# Patient Record
Sex: Male | Born: 1950 | Race: Asian | Hispanic: No | Marital: Single | Smoking: Never smoker
Health system: Southern US, Community
[De-identification: ages and names within clinical notes are randomized; demographics above are authoritative.]

## PROBLEM LIST (undated history)

## (undated) DIAGNOSIS — K219 Gastro-esophageal reflux disease without esophagitis: Secondary | ICD-10-CM

## (undated) DIAGNOSIS — I10 Essential (primary) hypertension: Secondary | ICD-10-CM

## (undated) DIAGNOSIS — R7611 Nonspecific reaction to tuberculin skin test without active tuberculosis: Secondary | ICD-10-CM

## (undated) DIAGNOSIS — H269 Unspecified cataract: Secondary | ICD-10-CM

## (undated) HISTORY — DX: Gastro-esophageal reflux disease without esophagitis: K21.9

## (undated) HISTORY — DX: Nonspecific reaction to tuberculin skin test without active tuberculosis: R76.11

## (undated) HISTORY — DX: Essential (primary) hypertension: I10

## (undated) HISTORY — DX: Unspecified cataract: H26.9

---

## 2005-11-07 DIAGNOSIS — I10 Essential (primary) hypertension: Secondary | ICD-10-CM

## 2008-08-27 ENCOUNTER — Ambulatory Visit: Payer: Self-pay | Admitting: Nurse Practitioner

## 2008-08-27 DIAGNOSIS — H269 Unspecified cataract: Secondary | ICD-10-CM | POA: Insufficient documentation

## 2008-08-27 DIAGNOSIS — K589 Irritable bowel syndrome without diarrhea: Secondary | ICD-10-CM | POA: Insufficient documentation

## 2008-08-27 DIAGNOSIS — M25569 Pain in unspecified knee: Secondary | ICD-10-CM | POA: Insufficient documentation

## 2008-08-27 DIAGNOSIS — K5900 Constipation, unspecified: Secondary | ICD-10-CM | POA: Insufficient documentation

## 2008-08-27 DIAGNOSIS — K219 Gastro-esophageal reflux disease without esophagitis: Secondary | ICD-10-CM | POA: Insufficient documentation

## 2008-08-27 LAB — CONVERTED CEMR LAB
AST: 23 units/L (ref 0–37)
Alkaline Phosphatase: 60 units/L (ref 39–117)
BUN: 13 mg/dL (ref 6–23)
Basophils Relative: 0 % (ref 0–1)
Creatinine, Ser: 0.94 mg/dL (ref 0.40–1.50)
Eosinophils Absolute: 0.2 10*3/uL (ref 0.0–0.7)
Hemoglobin: 13.8 g/dL (ref 13.0–17.0)
MCHC: 31.8 g/dL (ref 30.0–36.0)
MCV: 90.4 fL (ref 78.0–100.0)
Microalb, Ur: 0.2 mg/dL (ref 0.00–1.89)
Monocytes Absolute: 0.6 10*3/uL (ref 0.1–1.0)
Monocytes Relative: 8 % (ref 3–12)
Neutrophils Relative %: 61 % (ref 43–77)
RBC: 4.8 M/uL (ref 4.22–5.81)
Total Bilirubin: 0.4 mg/dL (ref 0.3–1.2)

## 2008-10-14 ENCOUNTER — Ambulatory Visit: Payer: Self-pay | Admitting: Nurse Practitioner

## 2008-11-13 ENCOUNTER — Encounter (INDEPENDENT_AMBULATORY_CARE_PROVIDER_SITE_OTHER): Payer: Self-pay | Admitting: Nurse Practitioner

## 2008-11-27 ENCOUNTER — Encounter (INDEPENDENT_AMBULATORY_CARE_PROVIDER_SITE_OTHER): Payer: Self-pay | Admitting: Nurse Practitioner

## 2008-12-11 ENCOUNTER — Encounter (INDEPENDENT_AMBULATORY_CARE_PROVIDER_SITE_OTHER): Payer: Self-pay | Admitting: Nurse Practitioner

## 2008-12-15 ENCOUNTER — Emergency Department (HOSPITAL_COMMUNITY): Admission: EM | Admit: 2008-12-15 | Discharge: 2008-12-15 | Payer: Self-pay | Admitting: Family Medicine

## 2009-01-12 ENCOUNTER — Ambulatory Visit: Payer: Self-pay | Admitting: Nurse Practitioner

## 2009-01-12 DIAGNOSIS — R059 Cough, unspecified: Secondary | ICD-10-CM | POA: Insufficient documentation

## 2009-01-12 DIAGNOSIS — R05 Cough: Secondary | ICD-10-CM

## 2009-02-11 ENCOUNTER — Ambulatory Visit: Payer: Self-pay | Admitting: Nurse Practitioner

## 2010-01-17 IMAGING — CR DG CHEST 2V
2 series · 2 of 2 positions shown · non-contrast
Comparison: No priors

CLINICAL DATA: Cough/chest pain

CHEST - 2 VIEW

[view not recorded (1 of 2)]
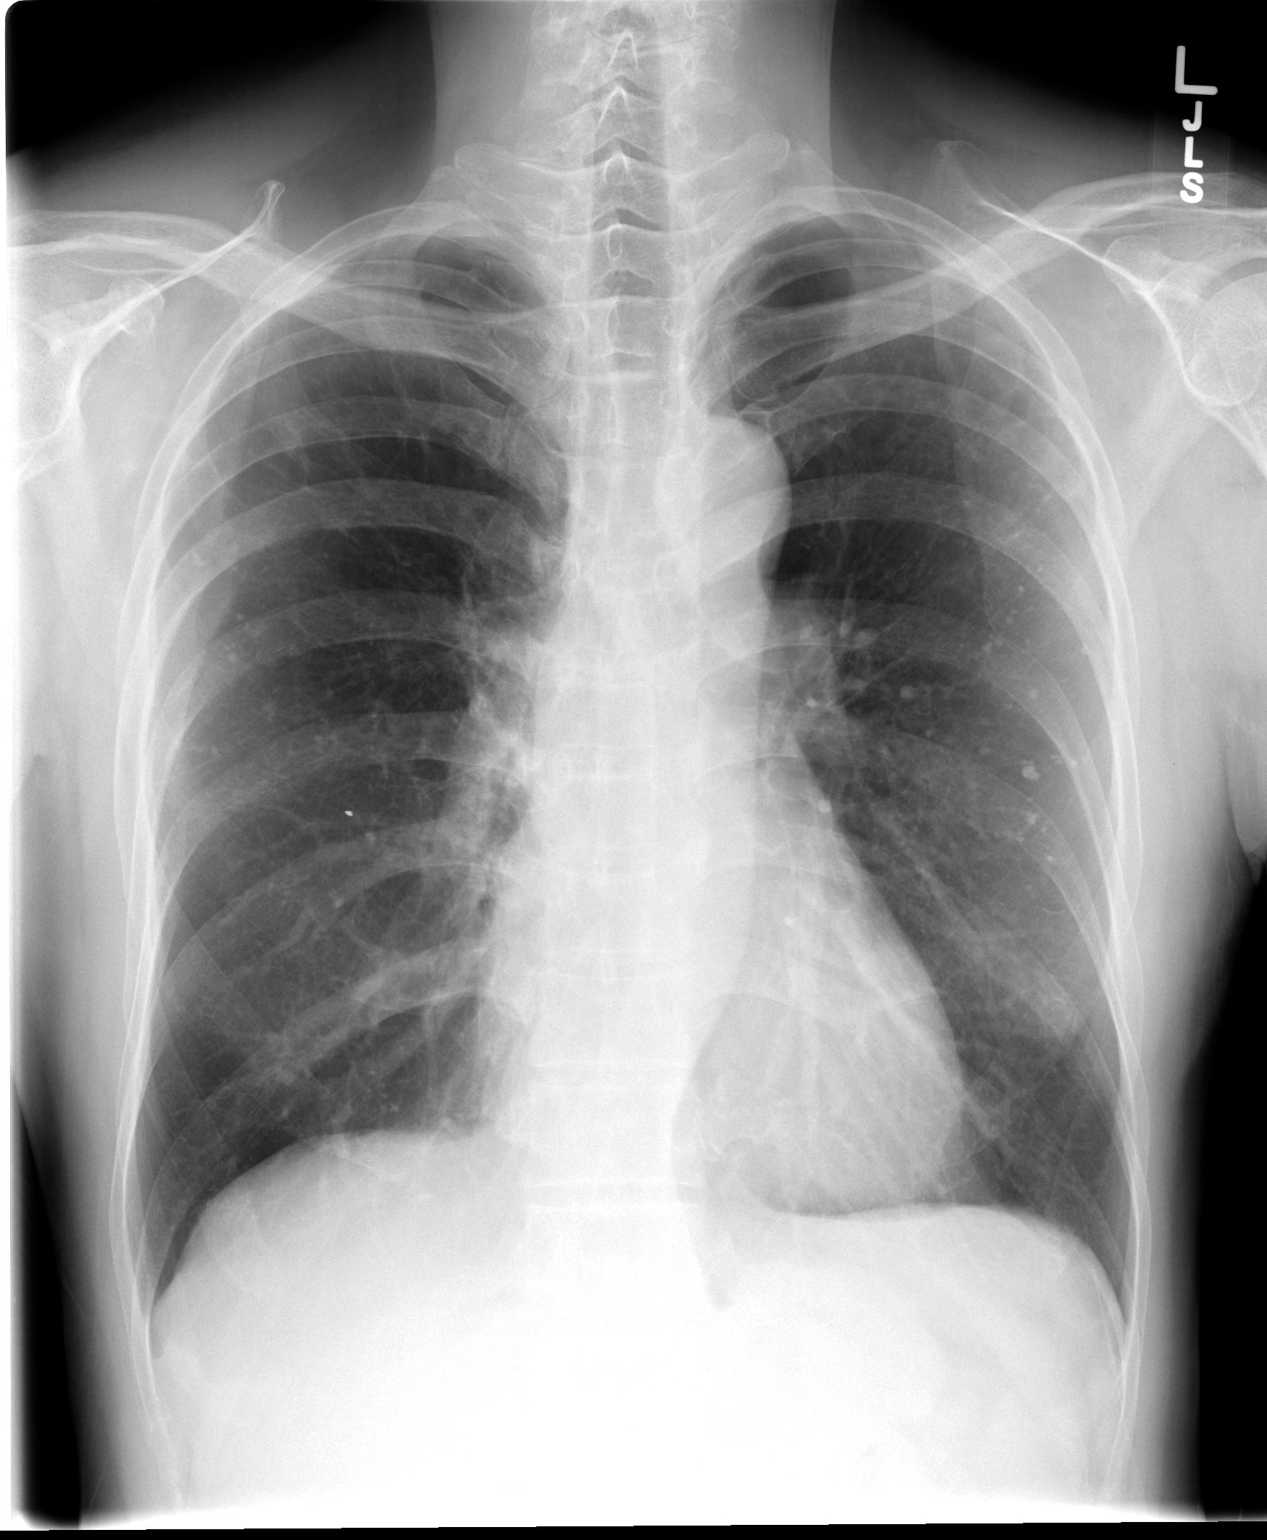

[view not recorded (2 of 2)]
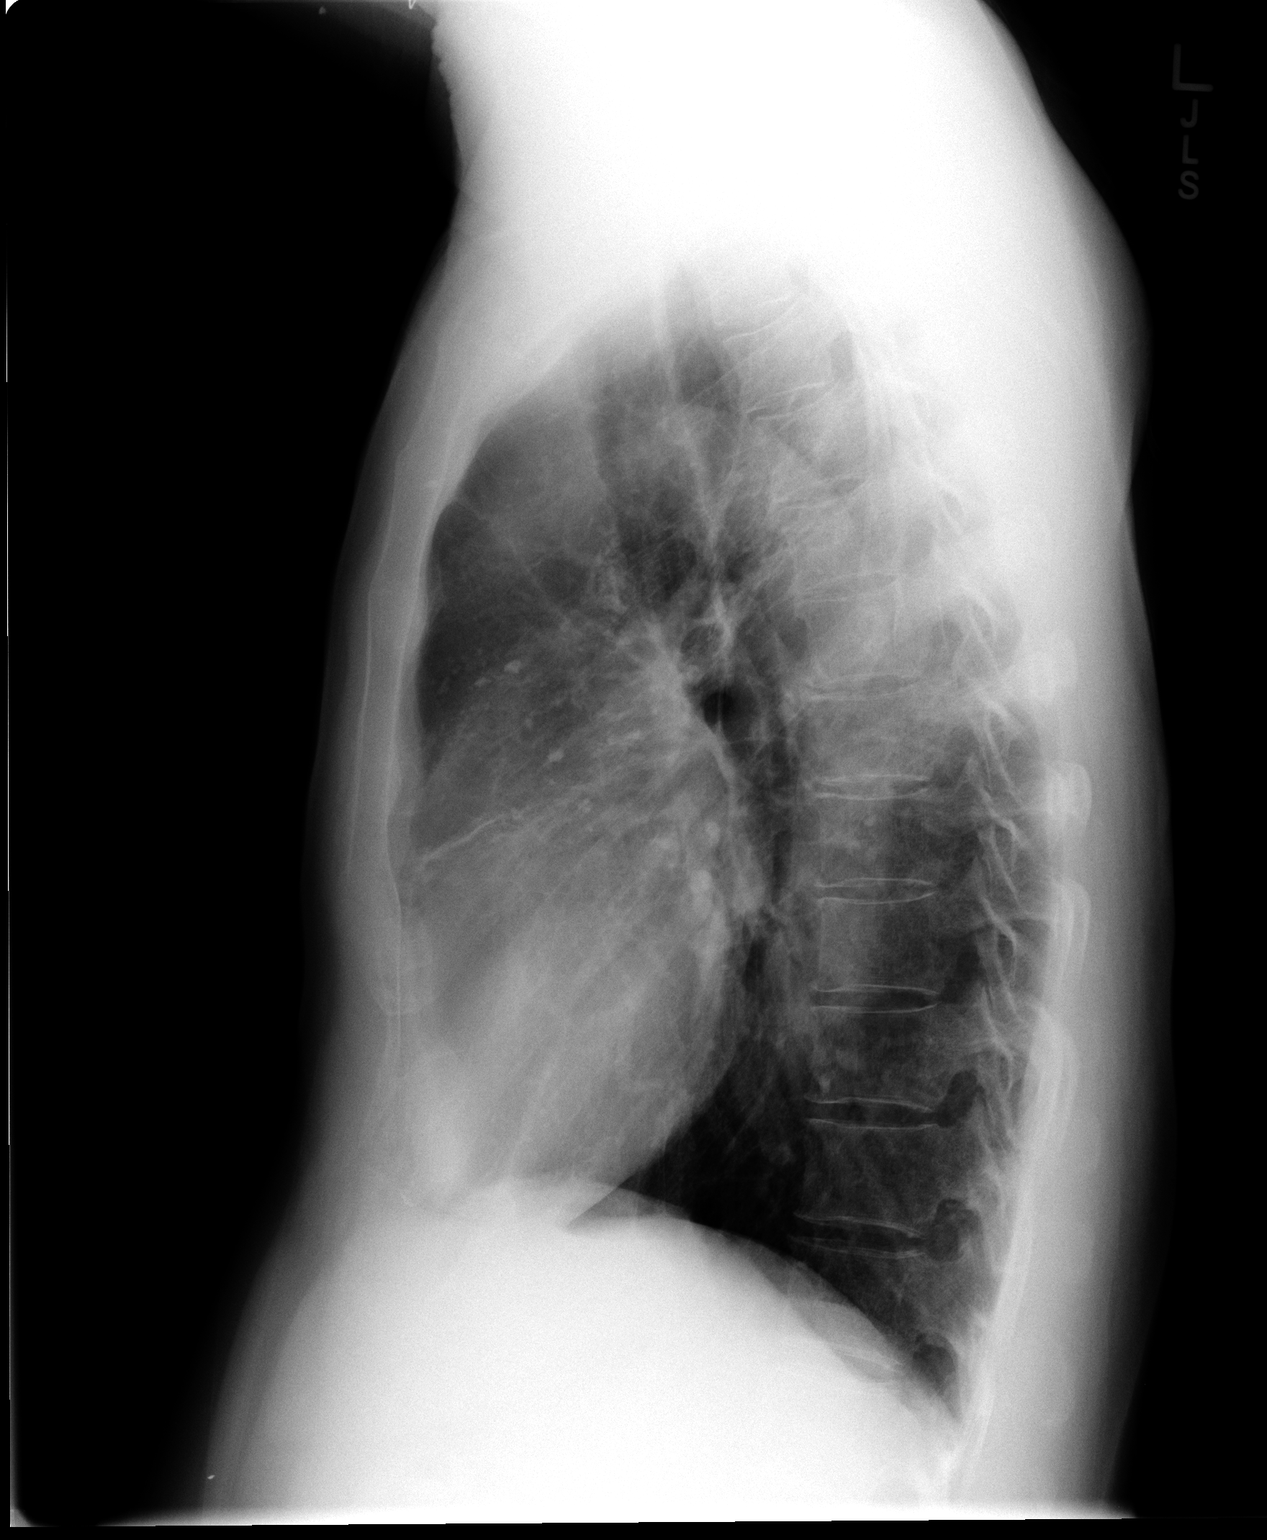

[2 of 2 positions shown; findings below may reference images not displayed]

FINDINGS: Heart size within normal limits.  Aorta mildly tortuous.
There are chronic lung changes with apical blebs, hyperaerated lung
bases, and multiple bilateral calcifications consistent with
granulomata.  No acute process.  Osseous structures intact.
IMPRESSION: .Chronic lung changes as above.  This includes old granulomatous
disease.  No acute process evident.

## 2010-07-01 ENCOUNTER — Ambulatory Visit: Payer: Self-pay | Admitting: Nurse Practitioner

## 2010-07-01 DIAGNOSIS — J329 Chronic sinusitis, unspecified: Secondary | ICD-10-CM | POA: Insufficient documentation

## 2010-07-01 DIAGNOSIS — R519 Headache, unspecified: Secondary | ICD-10-CM | POA: Insufficient documentation

## 2010-07-01 DIAGNOSIS — R51 Headache: Secondary | ICD-10-CM

## 2010-07-01 LAB — CONVERTED CEMR LAB
Blood in Urine, dipstick: NEGATIVE
Glucose, Urine, Semiquant: NEGATIVE
Ketones, urine, test strip: NEGATIVE
Specific Gravity, Urine: 1.015

## 2010-07-02 ENCOUNTER — Encounter (INDEPENDENT_AMBULATORY_CARE_PROVIDER_SITE_OTHER): Payer: Self-pay | Admitting: Nurse Practitioner

## 2010-07-02 LAB — CONVERTED CEMR LAB
Albumin: 4.7 g/dL (ref 3.5–5.2)
CO2: 24 meq/L (ref 19–32)
Calcium: 9.5 mg/dL (ref 8.4–10.5)
Chloride: 105 meq/L (ref 96–112)
Eosinophils Relative: 5 % (ref 0–5)
Glucose, Bld: 118 mg/dL — ABNORMAL HIGH (ref 70–99)
HCT: 39.8 % (ref 39.0–52.0)
Hemoglobin: 13.2 g/dL (ref 13.0–17.0)
Lymphocytes Relative: 42 % (ref 12–46)
Lymphs Abs: 2.2 10*3/uL (ref 0.7–4.0)
Microalb, Ur: 0.5 mg/dL (ref 0.00–1.89)
Monocytes Absolute: 0.5 10*3/uL (ref 0.1–1.0)
Platelets: 239 10*3/uL (ref 150–400)
Sodium: 142 meq/L (ref 135–145)
TSH: 2.247 microintl units/mL (ref 0.350–4.500)
Total Bilirubin: 0.4 mg/dL (ref 0.3–1.2)
Total Protein: 7.7 g/dL (ref 6.0–8.3)
WBC: 5.4 10*3/uL (ref 4.0–10.5)

## 2010-08-13 ENCOUNTER — Ambulatory Visit: Payer: Self-pay | Admitting: Nurse Practitioner

## 2010-08-13 DIAGNOSIS — J309 Allergic rhinitis, unspecified: Secondary | ICD-10-CM | POA: Insufficient documentation

## 2010-09-29 ENCOUNTER — Emergency Department (HOSPITAL_COMMUNITY)
Admission: EM | Admit: 2010-09-29 | Discharge: 2010-09-29 | Payer: Self-pay | Source: Home / Self Care | Admitting: Family Medicine

## 2010-11-05 ENCOUNTER — Ambulatory Visit: Payer: Self-pay | Admitting: Nurse Practitioner

## 2010-12-07 NOTE — Assessment & Plan Note (Signed)
Summary: Acute - Sinusitis   Vital Signs:  Patient profile:   60 year old male Weight:      154 pounds BMI:     23.33 Temp:     97.7 degrees F Pulse rate:   68 / minute Pulse rhythm:   regular Resp:     18 per minute BP sitting:   118 / 80  (left arm) Cuff size:   regular  Vitals Entered By: Vesta Mixer CMA (July 01, 2010 2:51 PM) CC: HA, Hypertension Management  Does patient need assistance? Ambulation Normal   CC:  HA and Hypertension Management.  History of Present Illness:  Pt into the office for headache.  Pt presents today with medications that are being sent to him from [place]. He has been taking blood pressure medications from [place] since his last visit here. He also has some allergy and sinus medications that he has purchased over the counter which include cetirizine and Vicks cold and sinus  He has been having cough and cold symptoms for the past 2 months. +sneezing +headache +achiness +fever and every 3-4 days/chills every evening  Using the language line for interpretation - vietnamese  Hypertension History:      He denies headache, chest pain, and palpitations.  He notes no problems with any antihypertensive medication side effects.  pt has been having his blood pressure checked every week in the community and it is doing well. Marland Kitchen        Positive major cardiovascular risk factors include male age 66 years old or older and hypertension.  Negative major cardiovascular risk factors include negative family history for ischemic heart disease and non-tobacco-user status.        Further assessment for target organ damage reveals no history of ASHD, cardiac end-organ damage (CHF/LVH), stroke/TIA, peripheral vascular disease, renal insufficiency, or hypertensive retinopathy.     Habits & Providers  Alcohol-Tobacco-Diet     Alcohol drinks/day: 0     Tobacco Status: never  Exercise-Depression-Behavior     Does Patient Exercise: yes     Type of exercise:  walking     Drug Use: no     Seat Belt Use: 100  Allergies: 1)  ! Lisinopril  Review of Systems General:  Complains of chills and fever; denies loss of appetite and sleep disorder. ENT:  Complains of hoarseness, nasal congestion, and sore throat; denies earache; gargles with warm salt water when sore throat starts. Resp:  Complains of cough. GI:  Complains of nausea; denies vomiting. Neuro:  Complains of headaches. Allergy:  Complains of itching eyes.  Physical Exam  General:  alert.   Head:  normocephalic.   Ears:  bil tM with clear fluid Nose:  inflammed turbinatets Mouth:  poor dentition.  missing upper teeth Lungs:  normal breath sounds.   Heart:  normal rate and regular rhythm.   Msk:  up to the exam table Neurologic:  alert & oriented X3.   Skin:  mid-back 2.5 cyst Psych:  Oriented X3.     Impression & Recommendations:  Problem # 1:  SINUSITIS (ICD-473.9) reviewed dx with pt will treat with amoxil and advise pt to continue cetirizine Orders: T-TSH ([id number])  His updated medication list for this problem includes:    Amoxicillin 500 Mg Caps (Amoxicillin) ..... One capsule by mouth three times a day for infection  Problem # 2:  HEADACHE (ICD-784.0) Most likely due to problem #1  The following medications were removed from the medication list:  Ibuprofen 600 Mg Tabs (Ibuprofen) .Marland Kitchen... 1 tablet by mouth two times a day as needed for knee pain His updated medication list for this problem includes:    Atenolol 50 Mg Tabs (Atenolol) .Marland Kitchen... 1/2 tablet by mouth daily for blood pressure  Orders: UA Dipstick w/o Micro (manual) (08657) T-Urine Microalbumin w/creat. ratio [id number]) T-Comprehensive Metabolic Panel [phone]) T-CBC w/Diff ([id number]) T-TSH [phone])  Problem # 3:  HYPERTENSION, BENIGN ESSENTIAL (ICD-401.1) Pt is taking meds from [place] unable to read names of medications except for atenolol pt is not able to afford medications in  the [place] His updated medication list for this problem includes:    Atenolol 50 Mg Tabs (Atenolol) .Marland Kitchen... 1/2 tablet by mouth daily for blood pressure    Nifedipine 20 Mg Caps (Nifedipine) .Marland Kitchen... 1 capsule by mouth daily for blood pressure    Cozaar 50 Mg Tabs (Losartan potassium) .Marland Kitchen... 1 tablet by mouth daily for blood pressure **pharmacy - discontinue lisinopril**  Orders: UA Dipstick w/o Micro (manual) (33295) T-Urine Microalbumin w/creat. ratio [phone]) T-Comprehensive Metabolic Panel [id number]) T-CBC w/Diff ([id number]) T-TSH [id number])  Complete Medication List: 1)  Omeprazole 20 Mg Cpdr (Omeprazole) .... One tablet by mouth two times a day before breakfast and dinner **pharmacy - discontinue cimetidine** 2)  Atenolol 50 Mg Tabs (Atenolol) .... 1/2 tablet by mouth daily for blood pressure 3)  Nifedipine 20 Mg Caps (Nifedipine) .Marland Kitchen.. 1 capsule by mouth daily for blood pressure 4)  Cozaar 50 Mg Tabs (Losartan potassium) .Marland Kitchen.. 1 tablet by mouth daily for blood pressure **pharmacy - discontinue lisinopril** 5)  Cetirizine Hcl 10 Mg Tabs (Cetirizine hcl) .... One tablet by mouth daily as needed for allergies 6)  Amoxicillin 500 Mg Caps (Amoxicillin) .... One capsule by mouth three times a day for infection  Hypertension Assessment/Plan:      The patient's hypertensive risk group is category B: At least one risk factor (excluding diabetes) with no target organ damage.  Today's blood pressure is 118/80.  His blood pressure goal is < 140/90.  Patient Instructions: 1)  Blood pressure - normal today 2)  Sinus infection - Take Amoxil 500mg  by mouth three times a day for infection.  Take with food. 3)  You will be informed of any abnormal lab results. 4)  Follow up as needed Prescriptions: AMOXICILLIN 500 MG CAPS (AMOXICILLIN) One capsule by mouth three times a day for infection  #30 x 0   Entered and Authorized by:   Lehman Prom FNP   Signed by:   Lehman Prom FNP on  07/01/2010   Method used:   Print then Give to Patient   RxID:   [id number]   Laboratory Results   Urine Tests  Date/Time Received: July 01, 2010 3:10 PM   Routine Urinalysis   Color: lt. yellow Glucose: negative   (Normal Range: Negative) Bilirubin: negative   (Normal Range: Negative) Ketone: negative   (Normal Range: Negative) Spec. Gravity: 1.015   (Normal Range: 1.003-1.035) Blood: negative   (Normal Range: Negative) pH: 7.0   (Normal Range: 5.0-8.0) Protein: negative   (Normal Range: Negative) Urobilinogen: 0.2   (Normal Range: 0-1) Nitrite: negative   (Normal Range: Negative) Leukocyte Esterace: negative   (Normal Range: Negative)

## 2010-12-07 NOTE — Assessment & Plan Note (Signed)
Summary: Allergic Rhiniits   Vital Signs:  Patient profile:   60 year old male Weight:      158.2 pounds Temp:     97.1 degrees F oral Pulse rate:   66 / minute Pulse rhythm:   regular Resp:     16 per minute BP sitting:   110 / 80  (left arm) Cuff size:   regular  Vitals Entered By: Levon Hedger (August 13, 2010 12:24 PM) CC: sinus issues with chills in body, Hypertension Management Is Patient Diabetic? No Pain Assessment Patient in pain? no       Does patient need assistance? Functional Status Self care Ambulation Normal   CC:  sinus issues with chills in body and Hypertension Management.  History of Present Illness:  Pt into the office with similar complaints as on previous visit Dx at that time with sinusitis he was prescribed amoxil x 10 days of which he finished. Reports that symptoms improved some but returned about 1 month ago but he was unable to return due to lapse of eligibility  Language line used for interpreter - vietnamese  Hypertension History:      He denies headache, chest pain, and palpitations.        Positive major cardiovascular risk factors include male age 61 years old or older and hypertension.  Negative major cardiovascular risk factors include negative family history for ischemic heart disease and non-tobacco-user status.        Further assessment for target organ damage reveals no history of ASHD, cardiac end-organ damage (CHF/LVH), stroke/TIA, peripheral vascular disease, renal insufficiency, or hypertensive retinopathy.     Allergies (verified): 1)  ! Lisinopril  Review of Systems General:  Complains of chills; in the evening. ENT:  Complains of nasal congestion; denies earache; +scratchy throat. CV:  Denies chest pain or discomfort. Resp:  Denies cough, shortness of breath, and wheezing.  Physical Exam  General:  alert.   Head:  normocephalic.   Eyes:  glasses Lungs:  normal breath sounds.   Heart:  normal rate and regular  rhythm.   Abdomen:  normal bowel sounds.   Msk:  normal ROM.   Neurologic:  alert & oriented X3.   Skin:  color normal.   Psych:  Oriented X3.    Flu Vaccine Consent Questions:    Do you have a history of severe allergic reactions to this vaccine? no    Any prior history of allergic reactions to egg and/or gelatin? no    Do you have a sensitivity to the preservative Thimersol? no    Do you have a past history of Guillan-Barre Syndrome? no    Do you currently have an acute febrile illness? no    Have you ever had a severe reaction to latex? no    Vaccine information given and explained to patient? yes   Impression & Recommendations:  Problem # 1:  ALLERGIC RHINITIS (ICD-477.9)  His updated medication list for this problem includes:    Cetirizine Hcl 10 Mg Tabs (Cetirizine hcl) ..... One tablet by mouth daily as needed for allergies    Fluticasone Propionate 50 Mcg/act Susp (Fluticasone propionate) ..... One spray in each nostril two times a day **hold head down**  Problem # 2:  HYPERTENSION, BENIGN ESSENTIAL (ICD-401.1) continue current medications as ordered His updated medication list for this problem includes:    Atenolol 50 Mg Tabs (Atenolol) .Marland Kitchen... 1/2 tablet by mouth daily for blood pressure    Nifedipine 20 Mg Caps (  Nifedipine) .Marland Kitchen... 1 capsule by mouth daily for blood pressure    Cozaar 50 Mg Tabs (Losartan potassium) .Marland Kitchen... 1 tablet by mouth daily for blood pressure **pharmacy - discontinue lisinopril**  Problem # 3:  NEED PROPHYLACTIC VACCINATION&INOCULATION FLU (ICD-V04.81)  given today in office  Orders: Admin 1st Vaccine (81191) Flu Vaccine 45yrs + (47829)  Complete Medication List: 1)  Omeprazole 20 Mg Cpdr (Omeprazole) .... One tablet by mouth two times a day before breakfast and dinner **pharmacy - discontinue cimetidine** 2)  Atenolol 50 Mg Tabs (Atenolol) .... 1/2 tablet by mouth daily for blood pressure 3)  Nifedipine 20 Mg Caps (Nifedipine) .Marland Kitchen.. 1 capsule by  mouth daily for blood pressure 4)  Cozaar 50 Mg Tabs (Losartan potassium) .Marland Kitchen.. 1 tablet by mouth daily for blood pressure **pharmacy - discontinue lisinopril** 5)  Cetirizine Hcl 10 Mg Tabs (Cetirizine hcl) .... One tablet by mouth daily as needed for allergies 6)  Fluticasone Propionate 50 Mcg/act Susp (Fluticasone propionate) .... One spray in each nostril two times a day **hold head down**  Other Orders: Capillary Blood Glucose/CBG (56213)  Hypertension Assessment/Plan:      The patient's hypertensive risk group is category B: At least one risk factor (excluding diabetes) with no target organ damage.  Today's blood pressure is 110/80.  His blood pressure goal is < 140/90.  Patient Instructions: 1)  You have received the flu vaccine today. 2)  No need for antibiotics at this time 3)  Continue all current mediations 4)  will start nasal spray - use twice per day. Hold head down  Prescriptions: FLUTICASONE PROPIONATE 50 MCG/ACT SUSP (FLUTICASONE PROPIONATE) ONe spray in each nostril two times a day **hold head down**  #1 x 1   Entered and Authorized by:   Lehman Prom FNP   Signed by:   Lehman Prom FNP on 08/13/2010   Method used:   Print then Give to Patient   RxID:   [id number]   Prevention & Chronic Care Immunizations   Influenza vaccine: Fluvax 3+  (10/14/2008)    Tetanus booster: Not documented    Pneumococcal vaccine: Not documented    H. zoster vaccine: Not documented  Colorectal Screening   Hemoccult: Not documented    Colonoscopy:  Results: Normal.   (11/13/2008)   Colonoscopy action/deferral: Repeat colonoscopy in 10 years.    (11/13/2008)  Other Screening   PSA: Not documented   Smoking status: never  (07/01/2010)  Lipids   Total Cholesterol: Not documented   LDL: Not documented   LDL Direct: Not documented   HDL: Not documented   Triglycerides: Not documented  Hypertension   Last Blood Pressure: 110 / 80  (08/13/2010)   Serum  creatinine: 0.82  (07/01/2010)   Serum potassium 4.1  (07/01/2010)  Self-Management Support :    Hypertension self-management support: Not documented   Nursing Instructions: Give Flu vaccine today   Appended Document: Allergic Rhiniits     Allergies: 1)  ! Lisinopril   Complete Medication List: 1)  Omeprazole 20 Mg Cpdr (Omeprazole) .... One tablet by mouth two times a day before breakfast and dinner **pharmacy - discontinue cimetidine** 2)  Atenolol 50 Mg Tabs (Atenolol) .... 1/2 tablet by mouth daily for blood pressure 3)  Nifedipine 20 Mg Caps (Nifedipine) .Marland Kitchen.. 1 capsule by mouth daily for blood pressure 4)  Cozaar 50 Mg Tabs (Losartan potassium) .Marland Kitchen.. 1 tablet by mouth daily for blood pressure **pharmacy - discontinue lisinopril** 5)  Cetirizine Hcl 10 Mg Tabs (Cetirizine hcl) .Marland KitchenMarland KitchenMarland Kitchen  One tablet by mouth daily as needed for allergies 6)  Fluticasone Propionate 50 Mcg/act Susp (Fluticasone propionate) .... One spray in each nostril two times a day **hold head down**  Other Orders: Flu Vaccine 17yrs + (16109) Admin 1st Vaccine (60454) Admin 1st Vaccine [facility]) [phone])    Influenza Vaccine    Vaccine Type: Fluvax 3+    Site: left deltoid    Mfr: GlaxoSmithKline    Dose: 0.5 ml    Route: IM    Given by: Levon Hedger    Exp. Date: 04/2011    Lot #: [id number]    VIS given: 06/01/10 version given August 13, 2010.  Flu Vaccine Consent Questions    Do you have a history of severe allergic reactions to this vaccine? no    Any prior history of allergic reactions to egg and/or gelatin? no    Do you have a sensitivity to the preservative Thimersol? no    Do you have a past history of Guillan-Barre Syndrome? no    Do you currently have an acute febrile illness? no    Have you ever had a severe reaction to latex? no    Vaccine information given and explained to patient? yes    ndc  [phone]

## 2010-12-07 NOTE — Letter (Signed)
Summary: *HSN Results Follow up  [facility]  [address] [place], [place] [address]   Phone: [phone]  Fax: [phone]      07/02/2010   Zachary Kaiser [address] [place], [place]  [address]   Dear  Mr. [name],                            ____S.Drinkard,FNP   ____D. Gore,FNP       ____B. McPherson,MD   ____V. Rankins,MD    ____E. Mulberry,MD    __X__N. Daphine Deutscher, FNP  ____D. Reche Dixon, MD    ____K. Philipp Deputy, MD    ____Other     This letter is to inform you that your recent test(s):  _______Pap Smear    ___X____Lab Test     _______X-ray    ___X____ is within acceptable limits  _______ requires a medication change  _______ requires a follow-up lab visit  _______ requires a follow-up visit with your provider   Comments:  Labs done during recent office visit are ok.       _________________________________________________________ If you have any questions, please contact our office [phone].                    Sincerely,    Lehman Prom FNP [facility]

## 2010-12-09 NOTE — Assessment & Plan Note (Signed)
Summary: GERD   Vital Signs:  Patient profile:   60 year old male Height:      68.25 inches Weight:      160 pounds BMI:     24.24 Temp:     97.0 degrees F oral Pulse rate:   72 / minute Pulse rhythm:   regular Resp:     18 per minute BP sitting:   118 / 80  (left arm) Cuff size:   regular  Vitals Entered By: [place] Shannon (November 05, 2010 8:32 AM) CC: when pt eats he has alot of discomfort in his stomach it feels like it does not digest, Abdominal Pain, Hypertension Management, Abdominal Pain Is Patient Diabetic? No Pain Assessment Patient in pain? no       Does patient need assistance? Functional Status Self care Ambulation Normal   CC:  when pt eats he has alot of discomfort in his stomach it feels like it does not digest, Abdominal Pain, Hypertension Management, and Abdominal Pain.  History of Present Illness:  Pt into the office with c/o abdominal pain.  [place] interpreter present today in office       This is a 60 year old man who presents with Abdominal Pain.  The symptoms began 1 month ago.  On a scale of 1 to 10, the intensity is described as a 5.  The patient reports nausea and constipation, but denies vomiting and diarrhea.  The patient denies the following symptoms: weight loss.   Eat 3 meals per day - breakfast, lunch and dinner.  pt did take some alka selzer over the counter which he presents today with pt. -spicy food +coffee one time per day -onions or garlic -tobacco  -ETOH   Dyspepsia History:      He has no alarm features of dyspepsia including no history of melena, hematochezia, dysphagia, persistent vomiting, or involuntary weight loss > 5%.  There is a prior history of GERD.  The patient does not have a prior history of documented ulcer disease.  An H-2 blocker medication is currently being taken.  A prior EGD has been done.    Hypertension History:      He denies headache, chest pain, and palpitations.  He notes no problems with any  antihypertensive medication side effects.  pt is taking meds from [place].        Positive major cardiovascular risk factors include male age 89 years old or older and hypertension.  Negative major cardiovascular risk factors include negative family history for ischemic heart disease and non-tobacco-user status.        Further assessment for target organ damage reveals no history of ASHD, cardiac end-organ damage (CHF/LVH), stroke/TIA, peripheral vascular disease, renal insufficiency, or hypertensive retinopathy.      Allergies (verified): 1)  ! Lisinopril  Review of Systems General:  Denies fever. CV:  Denies chest pain or discomfort. Resp:  Denies cough. GI:  Complains of constipation and nausea; denies vomiting.  Physical Exam  General:  alert.   Head:  normocephalic.   Mouth:  poor dentition.   Lungs:  normal breath sounds.   Heart:  normal rate and regular rhythm.   Abdomen:  non-tender and normal bowel sounds.   Msk:  normal ROM.   Neurologic:  alert & oriented X3.     Impression & Recommendations:  Problem # 1:  GERD (ICD-530.81) Reviewed with pt foods that make this problem worse instructed not to take alka-selzer daily as it contains aspirin and it  may do more harm than good over time.  His updated medication list for this problem includes:    Omeprazole 20 Mg Cpdr (Omeprazole) ..... One tablet by mouth two times a day before breakfast and dinner **pharmacy - discontinue cimetidine**    Ranitidine Hcl 150 Mg Tabs (Ranitidine hcl) ..... One table bty mouth 30 minutes before breakfast and 30 minutes before dinner  Problem # 2:  HYPERTENSION, BENIGN ESSENTIAL (ICD-401.1) BP stable pt to continue current medication His updated medication list for this problem includes:    Atenolol 50 Mg Tabs (Atenolol) .Marland Kitchen... 1/2 tablet by mouth daily for blood pressure    Nifedipine 20 Mg Caps (Nifedipine) .Marland Kitchen... 1 capsule by mouth daily for blood pressure    Cozaar 50 Mg Tabs (Losartan  potassium) .Marland Kitchen... 1 tablet by mouth daily for blood pressure **pharmacy - discontinue lisinopril**  Complete Medication List: 1)  Omeprazole 20 Mg Cpdr (Omeprazole) .... One tablet by mouth two times a day before breakfast and dinner **pharmacy - discontinue cimetidine** 2)  Atenolol 50 Mg Tabs (Atenolol) .... 1/2 tablet by mouth daily for blood pressure 3)  Nifedipine 20 Mg Caps (Nifedipine) .Marland Kitchen.. 1 capsule by mouth daily for blood pressure 4)  Cozaar 50 Mg Tabs (Losartan potassium) .Marland Kitchen.. 1 tablet by mouth daily for blood pressure **pharmacy - discontinue lisinopril** 5)  Cetirizine Hcl 10 Mg Tabs (Cetirizine hcl) .... One tablet by mouth daily as needed for allergies 6)  Fluticasone Propionate 50 Mcg/act Susp (Fluticasone propionate) .... One spray in each nostril two times a day **hold head down** 7)  Ranitidine Hcl 150 Mg Tabs (Ranitidine hcl) .... One table bty mouth 30 minutes before breakfast and 30 minutes before dinner  Dyspepsia Assessment/Plan:  Step Therapy: GERD Treatment Protocols:    Step-1: started    H-2 blocker chosen: Ranitidine 150mg  by mouth at bedtime  Hypertension Assessment/Plan:      The patient's hypertensive risk group is category B: At least one risk factor (excluding diabetes) with no target organ damage.  Today's blood pressure is 118/80.  His blood pressure goal is < 140/90.  Patient Instructions: 1)  Start Ranitidine 150mg  by mouth 30 minutes before breakfast and 30 minutes before dinner. 2)  The Alka Selter has aspirin so you should not take this daily as it may irritate your stomach. 3)  Blood pressure - ok today.  Continue current medications. 4)  Follow up as needed Prescriptions: RANITIDINE HCL 150 MG TABS (RANITIDINE HCL) One table bty mouth 30 minutes before breakfast and 30 minutes before dinner  #60 x 5   Entered and Authorized by:   Lehman Prom FNP   Signed by:   Lehman Prom FNP on 11/05/2010   Method used:   Print then Give to Patient    RxID:   [id number]    Orders Added: 1)  Est. Patient Level III [14782]

## 2015-07-11 ENCOUNTER — Ambulatory Visit (INDEPENDENT_AMBULATORY_CARE_PROVIDER_SITE_OTHER): Payer: Self-pay | Admitting: Family Medicine

## 2015-07-11 VITALS — BP 108/68 | HR 91 | Temp 98.9°F | Resp 17 | Ht 69.0 in | Wt 162.0 lb

## 2015-07-11 DIAGNOSIS — R21 Rash and other nonspecific skin eruption: Secondary | ICD-10-CM

## 2015-07-11 DIAGNOSIS — L259 Unspecified contact dermatitis, unspecified cause: Secondary | ICD-10-CM

## 2015-07-11 LAB — POCT GLYCOSYLATED HEMOGLOBIN (HGB A1C): Hemoglobin A1C: 5.7

## 2015-07-11 MED ORDER — TRIAMCINOLONE ACETONIDE 0.1 % EX CREA: 1.0000 "application " | TOPICAL_CREAM | Freq: Two times a day (BID) | CUTANEOUS | Status: AC | PRN

## 2015-07-11 MED ORDER — PREDNISONE 20 MG PO TABS
ORAL_TABLET | ORAL | Status: DC
Start: 2015-07-11 — End: 2015-07-11

## 2015-07-11 MED ORDER — PREDNISONE 20 MG PO TABS: ORAL_TABLET | ORAL | Status: AC

## 2015-07-11 NOTE — Progress Notes (Signed)
Chief Complaint:  Chief Complaint  Patient presents with  . Rash    all over arms of unknown origin     HPI: Zachary Kaiser is a 64 y.o. male who reports to [facility] today complaining of rash on his hands and then traveling up his arms for the last 1 week, he has it on his belly as well.  No fevers or chills Has tried otc hydrocrotisone, has tried benadryl, wihtout releif. No new meds, travels, etc. 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  He ahs no insurance, he gets his HTN meds from [place], it gets mailed to him. He states he has never had problems with these meds in the past.   Past Medical History  Diagnosis Date  . Hypertension   . Cataract   . GERD (gastroesophageal reflux disease)   . Positive PPD    History reviewed. No pertinent past surgical history. Social History   Social History  . Marital Status: Single    Spouse Name: N/A  . Number of Children: N/A  . Years of Education: N/A   Social History Main Topics  . Smoking status: Never Smoker   . Smokeless tobacco: None  . Alcohol Use: None  . Drug Use: None  . Sexual Activity: Not Asked   Other Topics Concern  . None   Social History Narrative   History reviewed. No pertinent family history. Allergies  Allergen Reactions  . Lisinopril     REACTION: cough   Prior to Admission medications   Medication Sig Start Date End Date Taking? Authorizing Provider  atenolol (TENORMIN) 50 MG tablet Take 50 mg by mouth daily.   Yes Historical Provider, MD  diphenhydrAMINE (SOMINEX) 25 MG tablet Take 25 mg by mouth at bedtime as needed for sleep.   Yes Historical Provider, MD  hydrocortisone cream 0.5 % Apply 1 application topically 2 (two) times daily.   Yes Historical Provider, MD  NIFEdipine (PROCARDIA) 20 MG capsule Take 20 mg by mouth 3 (three) times daily.   Yes Historical Provider, MD     ROS: The patient denies fevers, chills, night sweats, unintentional weight loss,  chest pain, palpitations, wheezing, dyspnea on exertion, nausea, vomiting, abdominal pain, dysuria, hematuria, melena, numbness, weakness, or tingling.   All other systems have been reviewed and were otherwise negative with the exception of those mentioned in the HPI and as above.    PHYSICAL EXAM: Filed Vitals:   07/11/15 1432  BP: 108/68  Pulse: 91  Temp: 98.9 F (37.2 C)  Resp: 17   Body mass index is 23.91 kg/(m^2).   General: Alert, no acute distress HEENT:  Normocephalic, atraumatic, oropharynx patent. EOMI, PERRLA Cardiovascular:  Regular rate and rhythm, no rubs murmurs or gallops.  No Carotid bruits, radial pulse intact. No pedal edema.  Respiratory: Clear to auscultation bilaterally.  No wheezes, rales, or rhonchi.  No cyanosis, no use of accessory musculature Abdominal: No organomegaly, abdomen is soft and non-tender, positive bowel sounds. No masses. Skin: No rashes. Neurologic: Facial musculature symmetric. Psychiatric: Patient acts appropriately throughout our interaction. Lymphatic: No cervical or submandibular lymphadenopathy Musculoskeletal: Gait intact. No edema, tenderness   LABS: Results for orders placed or performed in visit on 07/11/15  POCT glycosylated hemoglobin (Hb A1C)  Result Value Ref Range   Hemoglobin A1C 5.7      EKG/XRAY:   Primary read interpreted by Dr. Conley Rolls at [facility].  ASSESSMENT/PLAN: Encounter Diagnoses  Name Primary?  . Rash and nonspecific skin eruption Yes  . Contact dermatitis    Rx triamcinolone cream Rx prednisone taper Cont with benadryl  Fu prn   Gross sideeffects, risk and benefits, and alternatives of medications d/w patient. Patient is aware that all medications have potential sideeffects and we are unable to predict every sideeffect or drug-drug interaction that may occur.  Taino Maertens DO  07/12/2015 9:12 AM

## 2015-07-12 ENCOUNTER — Encounter: Payer: Self-pay | Admitting: Family Medicine
